# Patient Record
Sex: Female | Born: 1995 | Race: White | Hispanic: No | Marital: Single | Smoking: Never smoker
Health system: Southern US, Community
[De-identification: ages and names within clinical notes are randomized; demographics above are authoritative.]

## PROBLEM LIST (undated history)

## (undated) DIAGNOSIS — R7989 Other specified abnormal findings of blood chemistry: Secondary | ICD-10-CM

## (undated) DIAGNOSIS — E559 Vitamin D deficiency, unspecified: Secondary | ICD-10-CM

## (undated) DIAGNOSIS — E278 Other specified disorders of adrenal gland: Secondary | ICD-10-CM

## (undated) HISTORY — DX: Other specified disorders of adrenal gland: E27.8

## (undated) HISTORY — DX: Vitamin D deficiency, unspecified: E55.9

## (undated) HISTORY — DX: Other specified abnormal findings of blood chemistry: R79.89

---

## 2014-05-31 ENCOUNTER — Ambulatory Visit: Admit: 2014-05-31 | Disposition: A | Discharge status: Home / Self Care | Payer: Self-pay | Admitting: Family Medicine

## 2014-06-10 ENCOUNTER — Ambulatory Visit: Payer: Self-pay | Admitting: Family Medicine

## 2015-01-07 ENCOUNTER — Ambulatory Visit: Admit: 2015-01-07 | Disposition: A | Discharge status: Home / Self Care | Payer: Self-pay | Admitting: Family Medicine

## 2015-04-22 ENCOUNTER — Ambulatory Visit
Admission: RE | Admit: 2015-04-22 | Discharge: 2015-04-22 | Disposition: A | Discharge status: Home / Self Care | Payer: BLUE CROSS/BLUE SHIELD | Source: Ambulatory Visit | Attending: Family Medicine | Admitting: Family Medicine

## 2015-04-22 ENCOUNTER — Other Ambulatory Visit: Payer: Self-pay | Admitting: Family Medicine

## 2015-04-22 DIAGNOSIS — M79605 Pain in left leg: Principal | ICD-10-CM

## 2015-04-22 DIAGNOSIS — M545 Low back pain, unspecified: Secondary | ICD-10-CM

## 2015-05-29 ENCOUNTER — Other Ambulatory Visit: Payer: Self-pay | Admitting: Family Medicine

## 2015-05-29 ENCOUNTER — Ambulatory Visit
Admission: RE | Admit: 2015-05-29 | Discharge: 2015-05-29 | Disposition: A | Discharge status: Home / Self Care | Payer: BLUE CROSS/BLUE SHIELD | Source: Ambulatory Visit | Attending: Family Medicine | Admitting: Family Medicine

## 2015-05-29 ENCOUNTER — Ambulatory Visit
Admission: RE | Admit: 2015-05-29 | Discharge: 2015-05-29 | Disposition: A | Discharge status: Home / Self Care | Payer: BLUE CROSS/BLUE SHIELD | Source: Other Acute Inpatient Hospital | Attending: Family Medicine | Admitting: Family Medicine

## 2015-05-29 DIAGNOSIS — M25562 Pain in left knee: Secondary | ICD-10-CM | POA: Insufficient documentation

## 2015-05-29 DIAGNOSIS — T1490XA Injury, unspecified, initial encounter: Secondary | ICD-10-CM

## 2015-05-30 ENCOUNTER — Other Ambulatory Visit: Payer: Self-pay | Admitting: Family Medicine

## 2015-05-30 DIAGNOSIS — M25562 Pain in left knee: Secondary | ICD-10-CM

## 2015-05-30 DIAGNOSIS — S80912A Unspecified superficial injury of left knee, initial encounter: Secondary | ICD-10-CM | POA: Diagnosis not present

## 2015-05-31 ENCOUNTER — Ambulatory Visit
Admission: RE | Admit: 2015-05-31 | Discharge: 2015-05-31 | Disposition: A | Discharge status: Home / Self Care | Payer: BLUE CROSS/BLUE SHIELD | Source: Ambulatory Visit | Attending: Family Medicine | Admitting: Family Medicine

## 2015-05-31 DIAGNOSIS — M25562 Pain in left knee: Secondary | ICD-10-CM | POA: Insufficient documentation

## 2015-05-31 DIAGNOSIS — S83412A Sprain of medial collateral ligament of left knee, initial encounter: Secondary | ICD-10-CM | POA: Diagnosis not present

## 2015-05-31 DIAGNOSIS — X58XXXA Exposure to other specified factors, initial encounter: Secondary | ICD-10-CM | POA: Insufficient documentation

## 2015-08-17 ENCOUNTER — Encounter: Payer: Self-pay | Admitting: *Deleted

## 2015-08-18 ENCOUNTER — Encounter: Payer: Self-pay | Admitting: Obstetrics and Gynecology

## 2015-08-18 ENCOUNTER — Ambulatory Visit (INDEPENDENT_AMBULATORY_CARE_PROVIDER_SITE_OTHER): Payer: BLUE CROSS/BLUE SHIELD | Admitting: Obstetrics and Gynecology

## 2015-08-18 VITALS — BP 129/79 | HR 87 | Ht 71.0 in | Wt 196.8 lb

## 2015-08-18 DIAGNOSIS — Z3009 Encounter for other general counseling and advice on contraception: Secondary | ICD-10-CM

## 2015-08-18 DIAGNOSIS — Z309 Encounter for contraceptive management, unspecified: Secondary | ICD-10-CM | POA: Diagnosis not present

## 2015-08-18 NOTE — Progress Notes (Signed)
Patient ID: Connie Perry, female   DOB: 08/02/1996, 19 y.o.   MRN: [id number]  [facility]  Had Implanon and it was removed due to side effects, then took OCPs x 3 months and didn't like the way they made her feel either. Currently using condoms.  Is interested in an IUD and has reviewed the info given to her today on all her choices.  States her current menses occur every 4 weeks and last for 5 days with pretty heavy flow.  O: A&O x4  well groomed female in no distress PE not indicated.  A: contraception counseling  P: desires Christean GriefSkyla IUD- will call when returns from winter break and schedule insertion for menses in January.  Willis Kuipers [place], CNM

## 2015-08-18 NOTE — Patient Instructions (Signed)
Thank you for enrolling in MyChart. Please follow the instructions below to securely access your online medical record. MyChart allows you to send messages to your doctor, view your test results, renew your prescriptions, schedule appointments, and more.  How Do I Sign Up? 1. In your Internet browser, go to [email or web address]. 2. Click on the New  User? link in the Sign In box.  3. Enter your MyChart Access Code exactly as it appears below. You will not need to use this code after you have completed the sign-up process. If you do not sign up before the expiration date, you must request a new code. MyChart Access Code: [email or web address] Expires: 10/17/2015  1:58 PM  4. Enter the last four digits of your Social Security Number (xxxx) and Date of Birth (mm/dd/yyyy) as indicated and click Next. You will be taken to the next sign-up page. 5. Create a MyChart ID. This will be your MyChart login ID and cannot be changed, so think of one that is secure and easy to remember. 6. Create a MyChart password. You can change your password at any time. 7. Enter your Password Reset Question and Answer and click Next. This can be used at a later time if you forget your password.  8. Select your communication preference, and if applicable enter your e-mail address. You will receive e-mail notification when new information is available in MyChart by choosing to receive e-mail notifications and filling in your e-mail. 9. Click Sign In. You can now view your medical record.   Additional Information If you have questions, you can email [email or web address] or call [phone] to talk to our MyChart staff. Remember, MyChart is NOT to be used for urgent needs. For medical emergencies, dial 911.   Levonorgestrel intrauterine device (IUD) What is this medicine? LEVONORGESTREL IUD (LEE voe nor jes trel) is a contraceptive (birth control) device. The device is placed inside the uterus by a  healthcare professional. It is used to prevent pregnancy and can also be used to treat heavy bleeding that occurs during your period. Depending on the device, it can be used for 3 to 5 years. This medicine may be used for other purposes; ask your health care provider or pharmacist if you have questions. What should I tell my health care provider before I take this medicine? They need to know if you have any of these conditions: -abnormal Pap smear -cancer of the breast, uterus, or cervix -diabetes -endometritis -genital or pelvic infection now or in the past -have more than one sexual partner or your partner has more than one partner -heart disease -history of an ectopic or tubal pregnancy -immune system problems -IUD in place -liver disease or tumor -problems with blood clots or take blood-thinners -use intravenous drugs -uterus of unusual shape -vaginal bleeding that has not been explained -an unusual or allergic reaction to levonorgestrel, other hormones, silicone, or polyethylene, medicines, foods, dyes, or preservatives -pregnant or trying to get pregnant -breast-feeding How should I use this medicine? This device is placed inside the uterus by a health care professional. Talk to your pediatrician regarding the use of this medicine in children. Special care may be needed. Overdosage: If you think you have taken too much of this medicine contact a poison control center or emergency room at once. NOTE: This medicine is only for you. Do not share this medicine with others. What if I miss a dose? This does not apply. What may interact with this medicine? Do  not take this medicine with any of the following medications: -amprenavir -bosentan -fosamprenavir This medicine may also interact with the following medications: -aprepitant -barbiturate medicines for inducing sleep or treating seizures -bexarotene -griseofulvin -medicines to treat seizures like carbamazepine, ethotoin,  felbamate, oxcarbazepine, phenytoin, topiramate -modafinil -pioglitazone -rifabutin -rifampin -rifapentine -some medicines to treat HIV infection like atazanavir, indinavir, lopinavir, nelfinavir, tipranavir, ritonavir -St. John's wort -warfarin This list may not describe all possible interactions. Give your health care provider a list of all the medicines, herbs, non-prescription drugs, or dietary supplements you use. Also tell them if you smoke, drink alcohol, or use illegal drugs. Some items may interact with your medicine. What should I watch for while using this medicine? Visit your doctor or health care professional for regular check ups. See your doctor if you or your partner has sexual contact with others, becomes HIV positive, or gets a sexual transmitted disease. This product does not protect you against HIV infection (AIDS) or other sexually transmitted diseases. You can check the placement of the IUD yourself by reaching up to the top of your vagina with clean fingers to feel the threads. Do not pull on the threads. It is a good habit to check placement after each menstrual period. Call your doctor right away if you feel more of the IUD than just the threads or if you cannot feel the threads at all. The IUD may come out by itself. You may become pregnant if the device comes out. If you notice that the IUD has come out use a backup birth control method like condoms and call your health care provider. Using tampons will not change the position of the IUD and are okay to use during your period. What side effects may I notice from receiving this medicine? Side effects that you should report to your doctor or health care professional as soon as possible: -allergic reactions like skin rash, itching or hives, swelling of the face, lips, or tongue -fever, flu-like symptoms -genital sores -high blood pressure -no menstrual period for 6 weeks during use -pain, swelling, warmth in the  leg -pelvic pain or tenderness -severe or sudden headache -signs of pregnancy -stomach cramping -sudden shortness of breath -trouble with balance, talking, or walking -unusual vaginal bleeding, discharge -yellowing of the eyes or skin Side effects that usually do not require medical attention (report to your doctor or health care professional if they continue or are bothersome): -acne -breast pain -change in sex drive or performance -changes in weight -cramping, dizziness, or faintness while the device is being inserted -headache -irregular menstrual bleeding within first 3 to 6 months of use -nausea This list may not describe all possible side effects. Call your doctor for medical advice about side effects. You may report side effects to FDA at 1-800-FDA-1088. Where should I keep my medicine? This does not apply. NOTE: This sheet is a summary. It may not cover all possible information. If you have questions about this medicine, talk to your doctor, pharmacist, or health care provider.    2016, [employer]. (2011-09-27 13:54:04)

## 2015-12-19 ENCOUNTER — Encounter: Payer: Self-pay | Admitting: Family Medicine

## 2015-12-19 ENCOUNTER — Ambulatory Visit (INDEPENDENT_AMBULATORY_CARE_PROVIDER_SITE_OTHER): Payer: BLUE CROSS/BLUE SHIELD | Admitting: Family Medicine

## 2015-12-19 VITALS — BP 135/71 | HR 78 | Temp 98.1°F | Resp 16

## 2015-12-19 DIAGNOSIS — J029 Acute pharyngitis, unspecified: Secondary | ICD-10-CM

## 2015-12-19 LAB — POCT RAPID STREP A (OFFICE): Rapid Strep A Screen: POSITIVE — AB

## 2015-12-19 LAB — POCT INFLUENZA A/B

## 2015-12-19 MED ORDER — AMOXICILLIN 500 MG PO CAPS: 500.0000 mg | ORAL_CAPSULE | Freq: Two times a day (BID) | ORAL | Status: AC

## 2015-12-19 NOTE — Progress Notes (Signed)
Patient ID: Connie Perry, female   DOB: 09/19/1995, 20 y.o.   MRN: [id number]  Patient presents today with symptoms of sore throat and headache. She has been feeling hot and cold but has not had a documented fever. She denies any cough, nasal congestion, nausea, vomiting, diarrhea. She's had the symptoms for the last few days. She has had a history of mono in the past.  ROS: Negative except mentioned above. Vitals as per Epic.  GENERAL: NAD HEENT: moderate pharyngeal erythema, no exudate, no erythema of TMs, mild cervical LAD RESP: CTA B CARD: RRR ABD: +BS, NT/ND NEURO: CN II-XII grossly intact   A/P: Strep pharyngitis-rapid strep test was positive in the office today, flu screen was negative, rest, hydration, Tylenol/Motrin when necessary, Amoxicillin, change toothbrush in 2 days, seek medical attention if symptoms persist or worsen as discussed. She is not to do any athletic activity for at least 2 days and to return only if afebrile.

## 2016-12-19 ENCOUNTER — Telehealth: Payer: Self-pay | Admitting: Obstetrics and Gynecology

## 2016-12-19 NOTE — Telephone Encounter (Signed)
Patient lmom stating that she saw Mel for hormonal issues and Mel removed her Nexplanon, and she was feeling better, but she's starting to experience those same changes again. She wants to know what to do now. Please advise

## 2016-12-20 NOTE — Telephone Encounter (Signed)
Left message pt needs appt to discuss has been since 2016

## 2017-01-06 IMAGING — MR MR KNEE*L* W/O CM
6 series · 39 of 40 positions shown · non-contrast
Comparison: None.

CLINICAL DATA: LEFT knee pain. Volleyball injury 3 days ago. Knee
dislocation.

EXAM:
MRI OF THE LEFT KNEE WITHOUT CONTRAST
TECHNIQUE: Multiplanar, multisequence MR imaging of the knee was performed. No
intravenous contrast was administered.

[Series 3: PD fat-sat · axial · 3.0mm · 0.29mm/px · z∈[-44,+52]mm · 8 of 30 slices shown (1 of 3)]
[im 1/30]
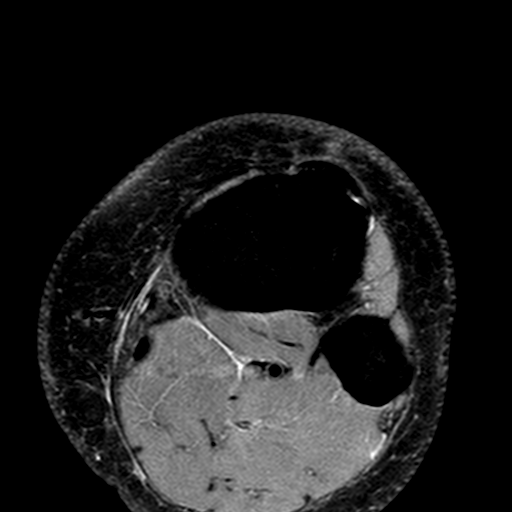
[im 5/30]
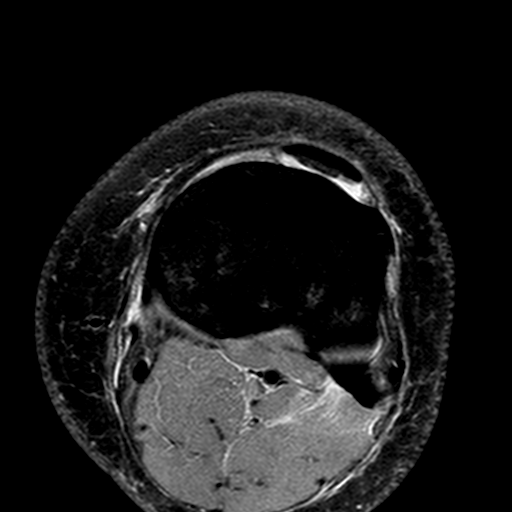
[im 9/30]
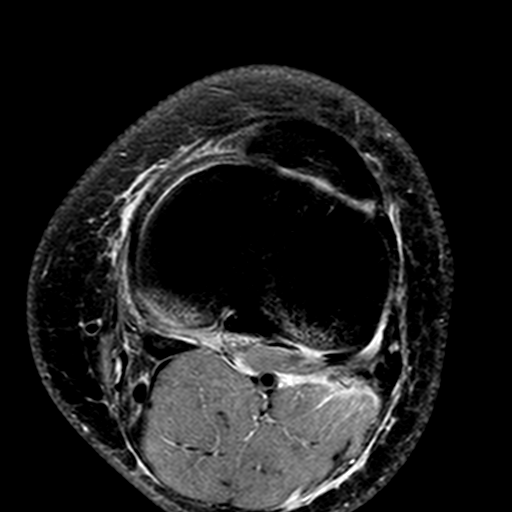
[im 13/30]
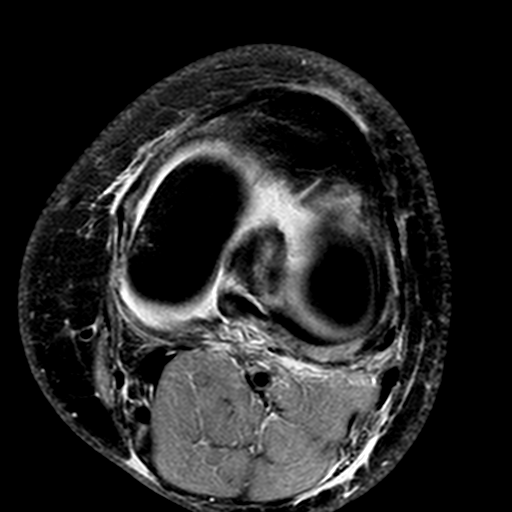
[im 17/30]
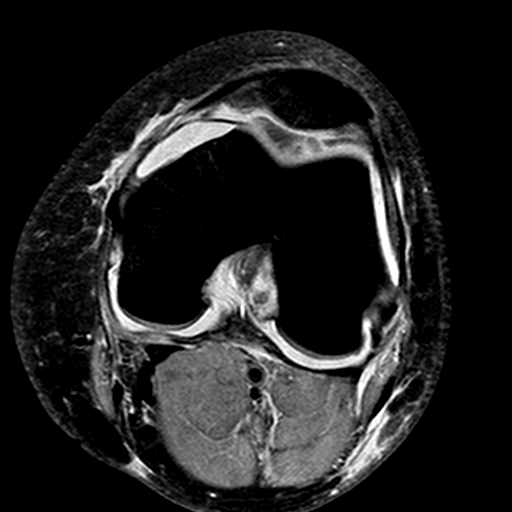
[im 21/30]
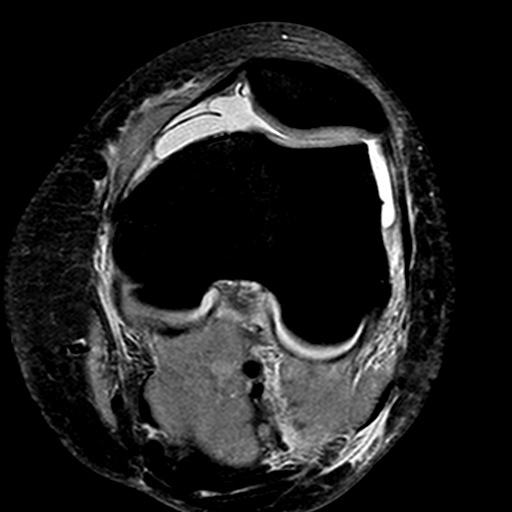
[im 25/30]
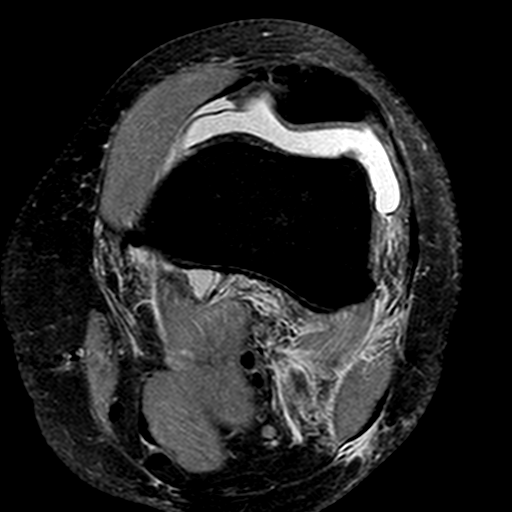
[im 30/30]
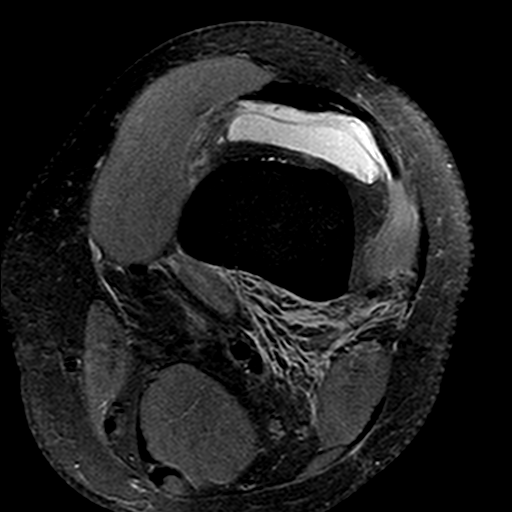

[Series 4: T2 fat-sat · coronal · 3.0mm · 0.62mm/px · 7 of 30 slices shown]
[im 1/30]
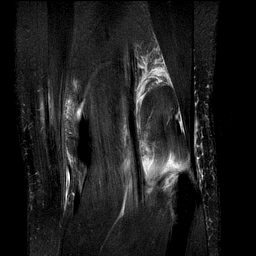
[im 5/30]
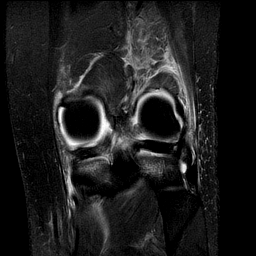
[im 10/30]
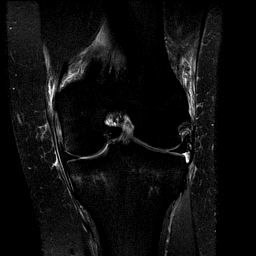
[im 15/30]
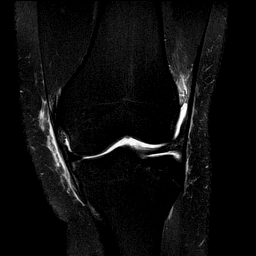
[im 20/30]
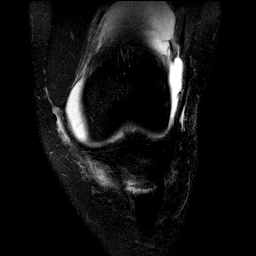
[im 25/30]
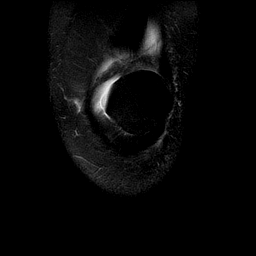
[im 30/30]
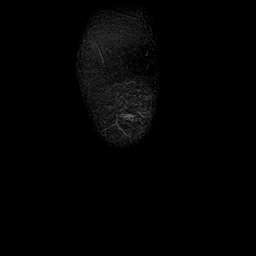

[Series 5: PD fat-sat · sagittal · 3.0mm · 0.62mm/px · 8 of 32 slices shown (2 of 3)]
[im 1/32]
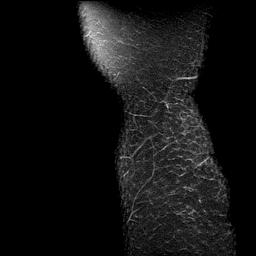
[im 5/32]
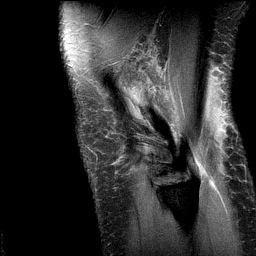
[im 9/32]
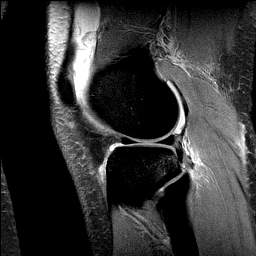
[im 14/32]
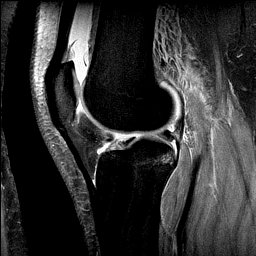
[im 18/32]
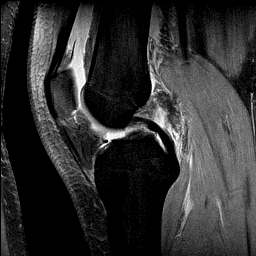
[im 23/32]
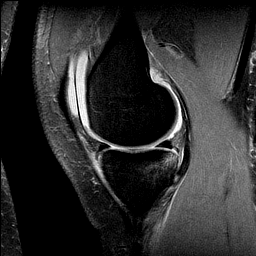
[im 27/32]
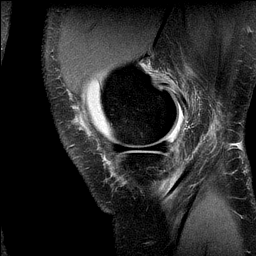
[im 32/32]
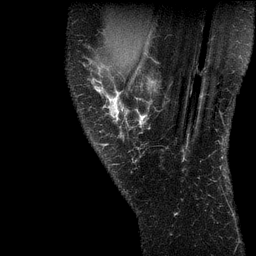

[Series 6: PD fat-sat · coronal · 3.0mm · 0.62mm/px · 7 of 30 slices shown (3 of 3)]
[im 1/30]
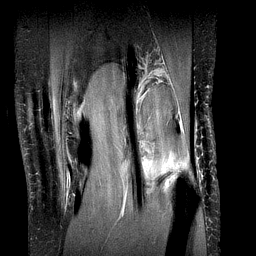
[im 5/30]
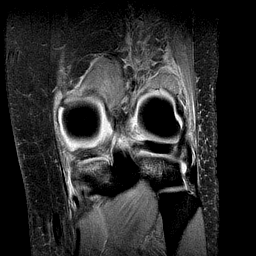
[im 10/30]
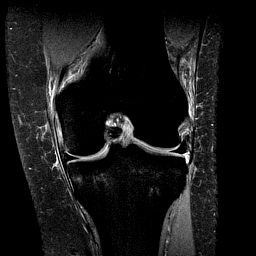
[im 15/30]
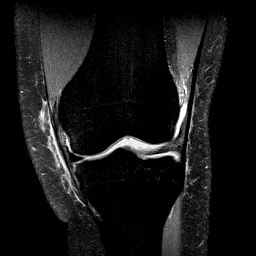
[im 20/30]
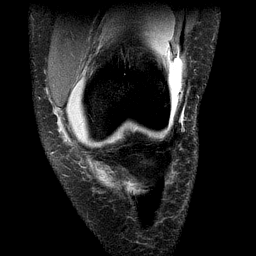
[im 25/30]
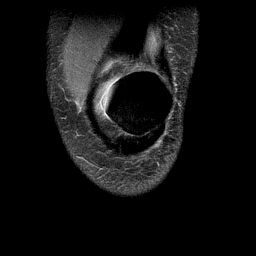
[im 30/30]
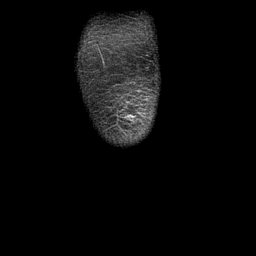

[Series 7: T1 · coronal · 3.0mm · 0.62mm/px · 6 of 30 slices shown]
[im 1/30]
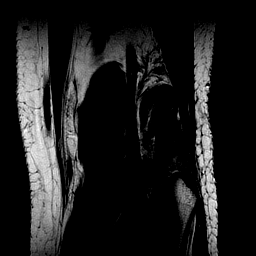
[im 5/30]
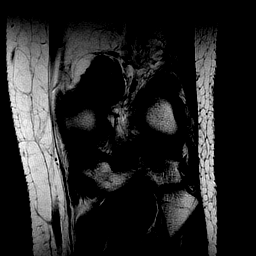
[im 10/30]
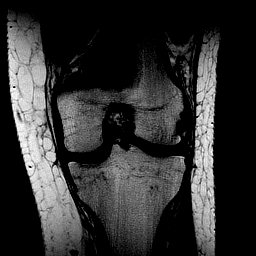
[im 15/30]
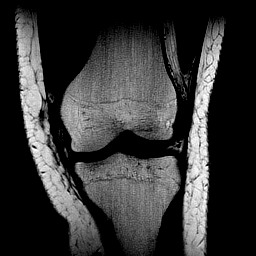
[im 20/30]
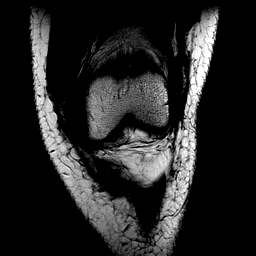
[im 25/30]
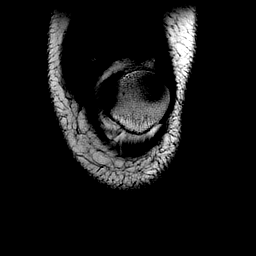

[Series 8: PD · oblique · 3.0mm · 0.31mm/px · 3 of 12 slices shown]
[im 1/12]
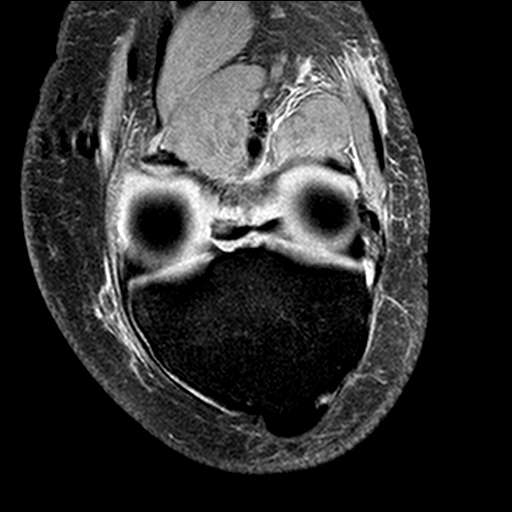
[im 6/12]
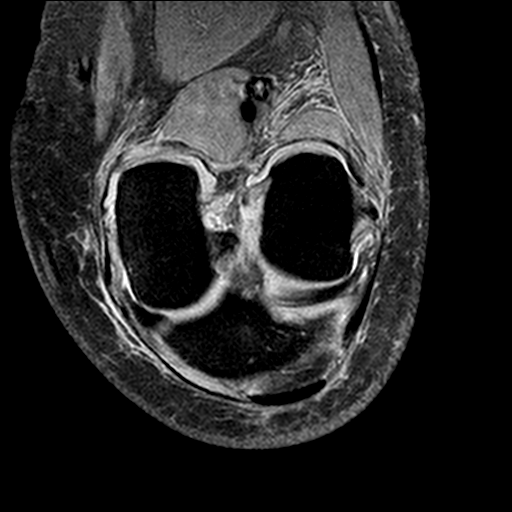
[im 12/12]
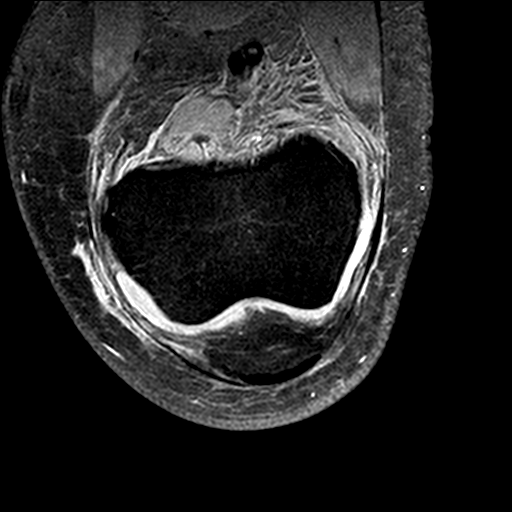

[39 of 40 positions shown; findings below may reference images not displayed]

FINDINGS: MENISCI

Medial meniscus:  Intact.

Lateral meniscus: Attenuation of the lateral meniscus posterior horn
root stent just inguinal radial tear or partial avulsion.

LIGAMENTS

Cruciates:  ACL rupture.  PCL intact.

Collaterals:  Fibular collateral ligament intact.  MCL sprain.

CARTILAGE

Patellofemoral:  Normal.  Small medial plica incidentally noted.

Medial:  Normal.

Lateral:  Normal.

Joint:  Large effusion.  Diffuse periarticular edema.

Popliteal Fossa: Posterolateral corner structures are edematous but
appear intact.

Extensor Mechanism:  Intact.

Bones:  Pivot shift contusions associated with ACL rupture.
IMPRESSION: 1. ACL rupture with pivot-shift contusions.
2. MCL sprain.
3. Attenuation of the lateral meniscus posterior horn root
suggesting partial avulsion or incomplete radial tear. Arthroscopic
inspection recommended.
